# Patient Record
Sex: Female | Born: 1945 | Race: Black or African American | Hispanic: No | Marital: Married | Smoking: Never smoker
Health system: Southern US, Community
[De-identification: ages and names within clinical notes are randomized; demographics above are authoritative.]

## PROBLEM LIST (undated history)

## (undated) DIAGNOSIS — I10 Essential (primary) hypertension: Secondary | ICD-10-CM

## (undated) DIAGNOSIS — E119 Type 2 diabetes mellitus without complications: Secondary | ICD-10-CM

---

## 2014-08-23 ENCOUNTER — Encounter (HOSPITAL_BASED_OUTPATIENT_CLINIC_OR_DEPARTMENT_OTHER): Payer: Self-pay | Admitting: Emergency Medicine

## 2014-08-23 ENCOUNTER — Emergency Department (HOSPITAL_BASED_OUTPATIENT_CLINIC_OR_DEPARTMENT_OTHER): Payer: Medicare Other

## 2014-08-23 ENCOUNTER — Emergency Department (HOSPITAL_BASED_OUTPATIENT_CLINIC_OR_DEPARTMENT_OTHER)
Admission: EM | Admit: 2014-08-23 | Discharge: 2014-08-23 | Disposition: A | Discharge status: Home / Self Care | Payer: Medicare Other | Attending: Emergency Medicine | Admitting: Emergency Medicine

## 2014-08-23 DIAGNOSIS — R109 Unspecified abdominal pain: Secondary | ICD-10-CM | POA: Diagnosis present

## 2014-08-23 DIAGNOSIS — K5900 Constipation, unspecified: Secondary | ICD-10-CM | POA: Insufficient documentation

## 2014-08-23 DIAGNOSIS — E1165 Type 2 diabetes mellitus with hyperglycemia: Secondary | ICD-10-CM | POA: Diagnosis not present

## 2014-08-23 DIAGNOSIS — I10 Essential (primary) hypertension: Secondary | ICD-10-CM | POA: Diagnosis not present

## 2014-08-23 DIAGNOSIS — K5732 Diverticulitis of large intestine without perforation or abscess without bleeding: Secondary | ICD-10-CM | POA: Insufficient documentation

## 2014-08-23 DIAGNOSIS — R739 Hyperglycemia, unspecified: Secondary | ICD-10-CM

## 2014-08-23 HISTORY — DX: Essential (primary) hypertension: I10

## 2014-08-23 HISTORY — DX: Type 2 diabetes mellitus without complications: E11.9

## 2014-08-23 LAB — URINALYSIS, ROUTINE W REFLEX MICROSCOPIC
Bilirubin Urine: NEGATIVE
Glucose, UA: NEGATIVE mg/dL
Hgb urine dipstick: NEGATIVE
Ketones, ur: NEGATIVE mg/dL
Leukocytes, UA: NEGATIVE
NITRITE: NEGATIVE
Protein, ur: NEGATIVE mg/dL
Specific Gravity, Urine: 1.026 (ref 1.005–1.030)
UROBILINOGEN UA: 1 mg/dL (ref 0.0–1.0)
pH: 5.5 (ref 5.0–8.0)

## 2014-08-23 LAB — COMPREHENSIVE METABOLIC PANEL
ALBUMIN: 3.5 g/dL (ref 3.5–5.0)
ALT: 44 U/L (ref 14–54)
ANION GAP: 9 (ref 5–15)
AST: 41 U/L (ref 15–41)
Alkaline Phosphatase: 73 U/L (ref 38–126)
BUN: 14 mg/dL (ref 6–20)
CO2: 30 mmol/L (ref 22–32)
Calcium: 9.3 mg/dL (ref 8.9–10.3)
Chloride: 98 mmol/L — ABNORMAL LOW (ref 101–111)
Creatinine, Ser: 0.82 mg/dL (ref 0.44–1.00)
GFR calc Af Amer: >60 mL/min (ref >60)
GFR calc non Af Amer: >60 mL/min (ref >60)
Glucose, Bld: 245 mg/dL — ABNORMAL HIGH (ref 65–99)
Potassium: 3.6 mmol/L (ref 3.5–5.1)
SODIUM: 137 mmol/L (ref 135–145)
Total Bilirubin: 0.4 mg/dL (ref 0.3–1.2)
Total Protein: 7.8 g/dL (ref 6.5–8.1)

## 2014-08-23 LAB — CBC WITH DIFFERENTIAL/PLATELET
Basophils Absolute: 0 K/uL (ref 0.0–0.1)
Basophils Relative: 0 % (ref 0–1)
Eosinophils Absolute: 0.3 K/uL (ref 0.0–0.7)
Eosinophils Relative: 3 % (ref 0–5)
HCT: 40.7 % (ref 36.0–46.0)
Hemoglobin: 12.7 g/dL (ref 12.0–15.0)
Lymphocytes Relative: 28 % (ref 12–46)
Lymphs Abs: 3.2 K/uL (ref 0.7–4.0)
MCH: 28.8 pg (ref 26.0–34.0)
MCHC: 31.2 g/dL (ref 30.0–36.0)
MCV: 92.3 fL (ref 78.0–100.0)
Monocytes Absolute: 0.6 K/uL (ref 0.1–1.0)
Monocytes Relative: 5 % (ref 3–12)
Neutro Abs: 7.5 K/uL (ref 1.7–7.7)
Neutrophils Relative %: 64 % (ref 43–77)
Platelets: 302 K/uL (ref 150–400)
RBC: 4.41 MIL/uL (ref 3.87–5.11)
RDW: 14.4 % (ref 11.5–15.5)
WBC: 11.6 K/uL — ABNORMAL HIGH (ref 4.0–10.5)

## 2014-08-23 MED ORDER — METRONIDAZOLE 500 MG PO TABS
500.0000 mg | ORAL_TABLET | Freq: Once | ORAL | Status: AC
  Administered 2014-08-23: 500 mg via ORAL
  Filled 2014-08-23: qty 1

## 2014-08-23 MED ORDER — CIPROFLOXACIN HCL 500 MG PO TABS
500.0000 mg | ORAL_TABLET | Freq: Once | ORAL | Status: AC
  Administered 2014-08-23: 500 mg via ORAL
  Filled 2014-08-23: qty 1

## 2014-08-23 MED ORDER — CIPROFLOXACIN HCL 500 MG PO TABS: 500.0000 mg | ORAL_TABLET | Freq: Two times a day (BID) | ORAL | Status: DC

## 2014-08-23 MED ORDER — SODIUM CHLORIDE 0.9 % IV BOLUS (SEPSIS)
1000.0000 mL | Freq: Once | INTRAVENOUS | Status: AC
  Administered 2014-08-23: 1000 mL via INTRAVENOUS

## 2014-08-23 MED ORDER — METRONIDAZOLE 500 MG PO TABS: 500.0000 mg | ORAL_TABLET | Freq: Two times a day (BID) | ORAL | Status: DC

## 2014-08-23 NOTE — ED Notes (Signed)
Pt in c/o lower abdominal pain x 1 week. Endorses constipation, dysuria, and frequency.

## 2014-08-23 NOTE — Discharge Instructions (Signed)
Diverticulitis °Diverticulitis is when small pockets that have formed in your colon (large intestine) become infected or swollen. °HOME CARE °· Follow your doctor's instructions. °· Follow a special diet if told by your doctor. °· When you feel better, your doctor may tell you to change your diet. You may be told to eat a lot of fiber. Fruits and vegetables are good sources of fiber. Fiber makes it easier to poop (have bowel movements). °· Take supplements or probiotics as told by your doctor. °· Only take medicines as told by your doctor. °· Keep all follow-up visits with your doctor. °GET HELP IF: °· Your pain does not get better. °· You have a hard time eating food. °· You are not pooping like normal. °GET HELP RIGHT AWAY IF: °· Your pain gets worse. °· Your problems do not get better. °· Your problems suddenly get worse. °· You have a fever. °· You keep throwing up (vomiting). °· You have bloody or black, tarry poop (stool). °MAKE SURE YOU:  °· Understand these instructions. °· Will watch your condition. °· Will get help right away if you are not doing well or get worse. °Document Released: 07/19/2007 Document Revised: 02/04/2013 Document Reviewed: 12/25/2012 °ExitCare® Patient Information ©2015 [employer]. This information is not intended to replace advice given to you by your health care provider. Make sure you discuss any questions you have with your health care provider. ° °

## 2014-08-23 NOTE — ED Provider Notes (Signed)
CSN: [id number]     Arrival date & time 08/23/14  2103 History   This chart was scribed for Regina Grizzleanielle Ashani Pumphrey, MD by Abel PrestoKara Demonbreun, ED Scribe. This patient was seen in room MH05/MH05 and the patient's care was started at 9:22 PM.    Chief Complaint  Patient presents with  . Abdominal Pain     The history is provided by the patient. No language interpreter was used.   HPI Comments: Regina Lee is a 69 y.o. female who presents to the Emergency Department complaining of intermittent cramping abdominal pain with onset 2 weeks ago. She reports today's episode began just after eating. Pt notes associated constipation and chills. She took a laxative for relief last night. She reports her last BM was today, she states it was "not much". She states she sometimes feels an urge to deficate but is unable to.  Pt reports h/o borderline DM and HTN. She takes HCTZ and metformin. She denies fever.   Past Medical History  Diagnosis Date  . Hypertension   . Diabetes mellitus without complication    History reviewed. No pertinent past surgical history. History reviewed. No pertinent family history. History  Substance Use Topics  . Smoking status: Never Smoker   . Smokeless tobacco: Never Used  . Alcohol Use: No   OB History    No data available     Review of Systems  Constitutional: Positive for chills. Negative for fever.  Gastrointestinal: Positive for abdominal pain and constipation.  All other systems reviewed and are negative.     Allergies  Review of patient's allergies indicates no known allergies.  Home Medications   Prior to Admission medications   Not on File   BP 163/91 mmHg  Pulse 111  Temp(Src) 98.9 F (37.2 C) (Oral)  Resp 18  Ht 5\' 3"  (1.6 m)  Wt 290 lb (131.543 kg)  BMI 51.38 kg/m2  SpO2 98% Physical Exam  Constitutional: She is oriented to person, place, and time. She appears well-developed and well-nourished.  HENT:  Head: Normocephalic.  Eyes: Conjunctivae are  normal.  Neck: Normal range of motion. Neck supple.  Pulmonary/Chest: Effort normal.  Abdominal: There is tenderness (mild diffuse lower abdomen ) in the suprapubic area and left lower quadrant.  Genitourinary:  Rectal exam: normal  Musculoskeletal: Normal range of motion.  Neurological: She is alert and oriented to person, place, and time.  Skin: Skin is warm and dry.  Psychiatric: She has a normal mood and affect. Her behavior is normal.  Nursing note and vitals reviewed.   ED Course  Procedures (including critical care time) DIAGNOSTIC STUDIES: Oxygen Saturation is 98% on room air, normal by my interpretation.    COORDINATION OF CARE: 9:28 PM Discussed treatment plan with patietnat beside, the patient agrees with the plan and has no further questions at this time.   Labs Review Labs Reviewed  URINALYSIS, ROUTINE W REFLEX MICROSCOPIC (NOT AT [facility]) - Abnormal; Notable for the following:    APPearance CLOUDY (*)    All other components within normal limits  CBC WITH DIFFERENTIAL/PLATELET - Abnormal; Notable for the following:    WBC 11.6 (*)    All other components within normal limits  COMPREHENSIVE METABOLIC PANEL - Abnormal; Notable for the following:    Chloride 98 (*)    Glucose, Bld 245 (*)    All other components within normal limits    Imaging Review Dg Abd 1 View  08/23/2014   CLINICAL DATA:  Cramping abdominal pain,  onset 2 weeks ago. Today began just after eating. Constipation and chills. History of hypertension and diabetes.  EXAM: ABDOMEN - 1 VIEW  COMPARISON:  None.  FINDINGS: Gas and stool in the colon. No small or large bowel distention. No radiopaque stones identified. Calcifications in the pelvis are probably phleboliths. Degenerative changes in the spine and hips.  IMPRESSION: Normal nonobstructive bowel gas pattern.   Electronically Signed   By: Burman Nieves M.D.   On: 08/23/2014 22:12     EKG Interpretation None      MDM   Final diagnoses:   Diverticulitis of large intestine without perforation or abscess without bleeding  Hyperglycemia   68 year old female who complains of some lower abdominal pain intermittently over the past week. She feels like she is constipated she feels like she needs to have a bowel movement on a regular basis although she has been stooling normally. She denies nausea, vomiting, diarrhea, fever, or chills. Her exam reveals some mild tenderness in suprapubic and left lower lobe quadrant quadrant area. White blood cell count is elevated at 11,600. Her blood sugar is elevated at 245 but she has not been taking her medications on a regular basis. Patient is advised to take her metformin. She has a very equivocal exam with very mild tenderness to deep palpation in the left lower quadrant. However, given her symptoms and her diabetes she will be treated empirically for diverticulitis She is given a prescription for Cipro and Flagyl. She is advised have close follow-up with her primary care physician. I personally performed the services described in this documentation, which was scribed in my presence. The recorded information has been reviewed and considered.     Regina Grizzle, MD 08/23/14 2251

## 2015-04-12 DIAGNOSIS — Z83511 Family history of glaucoma: Secondary | ICD-10-CM | POA: Diagnosis not present

## 2015-04-12 DIAGNOSIS — H40053 Ocular hypertension, bilateral: Secondary | ICD-10-CM | POA: Diagnosis not present

## 2015-08-09 DIAGNOSIS — M85862 Other specified disorders of bone density and structure, left lower leg: Secondary | ICD-10-CM | POA: Diagnosis not present

## 2015-08-09 DIAGNOSIS — I10 Essential (primary) hypertension: Secondary | ICD-10-CM | POA: Diagnosis not present

## 2015-08-09 DIAGNOSIS — Z23 Encounter for immunization: Secondary | ICD-10-CM | POA: Diagnosis not present

## 2015-08-09 DIAGNOSIS — E559 Vitamin D deficiency, unspecified: Secondary | ICD-10-CM | POA: Diagnosis not present

## 2015-08-09 DIAGNOSIS — Z9189 Other specified personal risk factors, not elsewhere classified: Secondary | ICD-10-CM | POA: Diagnosis not present

## 2015-08-09 DIAGNOSIS — Z13220 Encounter for screening for lipoid disorders: Secondary | ICD-10-CM | POA: Diagnosis not present

## 2015-08-09 DIAGNOSIS — Z7189 Other specified counseling: Secondary | ICD-10-CM | POA: Diagnosis not present

## 2015-08-09 DIAGNOSIS — E119 Type 2 diabetes mellitus without complications: Secondary | ICD-10-CM | POA: Diagnosis not present

## 2015-08-09 DIAGNOSIS — R05 Cough: Secondary | ICD-10-CM | POA: Diagnosis not present

## 2015-08-09 DIAGNOSIS — Z780 Asymptomatic menopausal state: Secondary | ICD-10-CM | POA: Diagnosis not present

## 2015-08-09 DIAGNOSIS — R5383 Other fatigue: Secondary | ICD-10-CM | POA: Diagnosis not present

## 2015-08-09 DIAGNOSIS — E1159 Type 2 diabetes mellitus with other circulatory complications: Secondary | ICD-10-CM | POA: Diagnosis not present

## 2015-08-26 DIAGNOSIS — K8080 Other cholelithiasis without obstruction: Secondary | ICD-10-CM | POA: Diagnosis not present

## 2015-08-26 DIAGNOSIS — R7989 Other specified abnormal findings of blood chemistry: Secondary | ICD-10-CM | POA: Diagnosis not present

## 2015-08-30 DIAGNOSIS — R05 Cough: Secondary | ICD-10-CM | POA: Diagnosis not present

## 2015-08-30 DIAGNOSIS — R062 Wheezing: Secondary | ICD-10-CM | POA: Diagnosis not present

## 2015-08-30 DIAGNOSIS — Z7984 Long term (current) use of oral hypoglycemic drugs: Secondary | ICD-10-CM | POA: Diagnosis not present

## 2015-08-30 DIAGNOSIS — E119 Type 2 diabetes mellitus without complications: Secondary | ICD-10-CM | POA: Diagnosis not present

## 2015-08-30 DIAGNOSIS — T464X5A Adverse effect of angiotensin-converting-enzyme inhibitors, initial encounter: Secondary | ICD-10-CM | POA: Diagnosis not present

## 2015-08-30 DIAGNOSIS — Z794 Long term (current) use of insulin: Secondary | ICD-10-CM | POA: Diagnosis not present

## 2015-08-30 DIAGNOSIS — I10 Essential (primary) hypertension: Secondary | ICD-10-CM | POA: Diagnosis not present

## 2015-08-30 DIAGNOSIS — Z6841 Body Mass Index (BMI) 40.0 and over, adult: Secondary | ICD-10-CM | POA: Diagnosis not present

## 2015-08-30 DIAGNOSIS — Z7951 Long term (current) use of inhaled steroids: Secondary | ICD-10-CM | POA: Diagnosis not present

## 2015-08-30 DIAGNOSIS — E1159 Type 2 diabetes mellitus with other circulatory complications: Secondary | ICD-10-CM | POA: Diagnosis not present

## 2015-08-30 DIAGNOSIS — Z9189 Other specified personal risk factors, not elsewhere classified: Secondary | ICD-10-CM | POA: Diagnosis not present

## 2015-08-30 DIAGNOSIS — R748 Abnormal levels of other serum enzymes: Secondary | ICD-10-CM | POA: Diagnosis not present

## 2015-08-30 DIAGNOSIS — Z79899 Other long term (current) drug therapy: Secondary | ICD-10-CM | POA: Diagnosis not present

## 2015-08-30 DIAGNOSIS — R011 Cardiac murmur, unspecified: Secondary | ICD-10-CM | POA: Diagnosis not present

## 2015-09-07 DIAGNOSIS — E119 Type 2 diabetes mellitus without complications: Secondary | ICD-10-CM | POA: Diagnosis not present

## 2015-09-16 DIAGNOSIS — Z780 Asymptomatic menopausal state: Secondary | ICD-10-CM | POA: Diagnosis not present

## 2015-09-28 DIAGNOSIS — E119 Type 2 diabetes mellitus without complications: Secondary | ICD-10-CM | POA: Diagnosis not present

## 2015-10-15 DIAGNOSIS — E559 Vitamin D deficiency, unspecified: Secondary | ICD-10-CM | POA: Diagnosis not present

## 2015-10-15 DIAGNOSIS — Z6841 Body Mass Index (BMI) 40.0 and over, adult: Secondary | ICD-10-CM | POA: Diagnosis not present

## 2015-10-15 DIAGNOSIS — E119 Type 2 diabetes mellitus without complications: Secondary | ICD-10-CM | POA: Diagnosis not present

## 2015-10-15 DIAGNOSIS — R748 Abnormal levels of other serum enzymes: Secondary | ICD-10-CM | POA: Diagnosis not present

## 2015-10-15 DIAGNOSIS — R011 Cardiac murmur, unspecified: Secondary | ICD-10-CM | POA: Diagnosis not present

## 2015-11-03 DIAGNOSIS — R011 Cardiac murmur, unspecified: Secondary | ICD-10-CM | POA: Diagnosis not present

## 2015-11-15 DIAGNOSIS — R748 Abnormal levels of other serum enzymes: Secondary | ICD-10-CM | POA: Diagnosis not present

## 2015-11-15 DIAGNOSIS — E1159 Type 2 diabetes mellitus with other circulatory complications: Secondary | ICD-10-CM | POA: Diagnosis not present

## 2015-11-15 DIAGNOSIS — E559 Vitamin D deficiency, unspecified: Secondary | ICD-10-CM | POA: Diagnosis not present

## 2015-11-15 DIAGNOSIS — I10 Essential (primary) hypertension: Secondary | ICD-10-CM | POA: Diagnosis not present

## 2015-11-15 DIAGNOSIS — Z9189 Other specified personal risk factors, not elsewhere classified: Secondary | ICD-10-CM | POA: Diagnosis not present

## 2015-11-15 DIAGNOSIS — Z6841 Body Mass Index (BMI) 40.0 and over, adult: Secondary | ICD-10-CM | POA: Diagnosis not present

## 2015-11-15 DIAGNOSIS — R011 Cardiac murmur, unspecified: Secondary | ICD-10-CM | POA: Diagnosis not present

## 2015-11-15 DIAGNOSIS — R5383 Other fatigue: Secondary | ICD-10-CM | POA: Diagnosis not present

## 2015-11-15 DIAGNOSIS — E119 Type 2 diabetes mellitus without complications: Secondary | ICD-10-CM | POA: Diagnosis not present

## 2015-12-27 DIAGNOSIS — E559 Vitamin D deficiency, unspecified: Secondary | ICD-10-CM | POA: Diagnosis not present

## 2015-12-27 DIAGNOSIS — T464X5A Adverse effect of angiotensin-converting-enzyme inhibitors, initial encounter: Secondary | ICD-10-CM | POA: Diagnosis not present

## 2015-12-27 DIAGNOSIS — Z6841 Body Mass Index (BMI) 40.0 and over, adult: Secondary | ICD-10-CM | POA: Diagnosis not present

## 2015-12-27 DIAGNOSIS — I10 Essential (primary) hypertension: Secondary | ICD-10-CM | POA: Diagnosis not present

## 2015-12-27 DIAGNOSIS — Z9189 Other specified personal risk factors, not elsewhere classified: Secondary | ICD-10-CM | POA: Diagnosis not present

## 2015-12-27 DIAGNOSIS — R748 Abnormal levels of other serum enzymes: Secondary | ICD-10-CM | POA: Diagnosis not present

## 2015-12-27 DIAGNOSIS — R7989 Other specified abnormal findings of blood chemistry: Secondary | ICD-10-CM | POA: Diagnosis not present

## 2015-12-27 DIAGNOSIS — R05 Cough: Secondary | ICD-10-CM | POA: Diagnosis not present

## 2015-12-27 DIAGNOSIS — E1159 Type 2 diabetes mellitus with other circulatory complications: Secondary | ICD-10-CM | POA: Diagnosis not present

## 2015-12-27 DIAGNOSIS — E119 Type 2 diabetes mellitus without complications: Secondary | ICD-10-CM | POA: Diagnosis not present

## 2015-12-30 DIAGNOSIS — R748 Abnormal levels of other serum enzymes: Secondary | ICD-10-CM | POA: Diagnosis not present

## 2016-01-11 DIAGNOSIS — T464X5A Adverse effect of angiotensin-converting-enzyme inhibitors, initial encounter: Secondary | ICD-10-CM | POA: Diagnosis not present

## 2016-01-11 DIAGNOSIS — I10 Essential (primary) hypertension: Secondary | ICD-10-CM | POA: Diagnosis not present

## 2016-01-11 DIAGNOSIS — E119 Type 2 diabetes mellitus without complications: Secondary | ICD-10-CM | POA: Diagnosis not present

## 2016-01-11 DIAGNOSIS — R748 Abnormal levels of other serum enzymes: Secondary | ICD-10-CM | POA: Diagnosis not present

## 2016-01-11 DIAGNOSIS — Z9189 Other specified personal risk factors, not elsewhere classified: Secondary | ICD-10-CM | POA: Diagnosis not present

## 2016-01-11 DIAGNOSIS — R05 Cough: Secondary | ICD-10-CM | POA: Diagnosis not present

## 2016-01-11 DIAGNOSIS — E1159 Type 2 diabetes mellitus with other circulatory complications: Secondary | ICD-10-CM | POA: Diagnosis not present

## 2016-01-13 DIAGNOSIS — R7989 Other specified abnormal findings of blood chemistry: Secondary | ICD-10-CM | POA: Diagnosis not present

## 2016-01-26 ENCOUNTER — Encounter (HOSPITAL_BASED_OUTPATIENT_CLINIC_OR_DEPARTMENT_OTHER): Payer: Self-pay

## 2016-01-26 ENCOUNTER — Emergency Department (HOSPITAL_BASED_OUTPATIENT_CLINIC_OR_DEPARTMENT_OTHER)
Admission: EM | Admit: 2016-01-26 | Discharge: 2016-01-26 | Disposition: A | Discharge status: Home / Self Care | Payer: PPO | Attending: Emergency Medicine | Admitting: Emergency Medicine

## 2016-01-26 DIAGNOSIS — Z7984 Long term (current) use of oral hypoglycemic drugs: Secondary | ICD-10-CM | POA: Insufficient documentation

## 2016-01-26 DIAGNOSIS — Y9389 Activity, other specified: Secondary | ICD-10-CM | POA: Diagnosis not present

## 2016-01-26 DIAGNOSIS — E119 Type 2 diabetes mellitus without complications: Secondary | ICD-10-CM | POA: Diagnosis not present

## 2016-01-26 DIAGNOSIS — Y999 Unspecified external cause status: Secondary | ICD-10-CM | POA: Diagnosis not present

## 2016-01-26 DIAGNOSIS — I10 Essential (primary) hypertension: Secondary | ICD-10-CM | POA: Diagnosis not present

## 2016-01-26 DIAGNOSIS — Z7982 Long term (current) use of aspirin: Secondary | ICD-10-CM | POA: Diagnosis not present

## 2016-01-26 DIAGNOSIS — X58XXXA Exposure to other specified factors, initial encounter: Secondary | ICD-10-CM | POA: Insufficient documentation

## 2016-01-26 DIAGNOSIS — Z79899 Other long term (current) drug therapy: Secondary | ICD-10-CM | POA: Insufficient documentation

## 2016-01-26 DIAGNOSIS — S46812A Strain of other muscles, fascia and tendons at shoulder and upper arm level, left arm, initial encounter: Secondary | ICD-10-CM | POA: Diagnosis not present

## 2016-01-26 DIAGNOSIS — Y929 Unspecified place or not applicable: Secondary | ICD-10-CM | POA: Diagnosis not present

## 2016-01-26 DIAGNOSIS — S56912A Strain of unspecified muscles, fascia and tendons at forearm level, left arm, initial encounter: Secondary | ICD-10-CM | POA: Diagnosis not present

## 2016-01-26 DIAGNOSIS — S4992XA Unspecified injury of left shoulder and upper arm, initial encounter: Secondary | ICD-10-CM | POA: Diagnosis not present

## 2016-01-26 MED ORDER — NAPROXEN 250 MG PO TABS
500.0000 mg | ORAL_TABLET | Freq: Once | ORAL | Status: AC
  Administered 2016-01-26: 500 mg via ORAL
  Filled 2016-01-26: qty 2

## 2016-01-26 MED ORDER — ACETAMINOPHEN 500 MG PO TABS
1000.0000 mg | ORAL_TABLET | Freq: Once | ORAL | Status: AC
  Administered 2016-01-26: 1000 mg via ORAL
  Filled 2016-01-26: qty 2

## 2016-01-26 NOTE — ED Triage Notes (Addendum)
C/o pain to left arm and left posterior neck x 3 days-denies injury-states pain is better with heating pad-NAD-steady gait

## 2016-01-26 NOTE — ED Provider Notes (Addendum)
[facility]   CSN: [id number] Arrival date & time: 01/26/16  1452     History   Chief Complaint Chief Complaint  Patient presents with  . Arm Pain    HPI Regina CoreRenee Lee is a 70 y.o. female.  70 yo F with a chief complaint of left shoulder pain. This been going on for the past 3 days. Patient denies injury. She has been spending a lot of time sitting at a desk for the past few days. Worse with palpation. Denies chest pain denies exertional symptoms denies diaphoresis denies nausea or vomiting.   The history is provided by the patient. The history is limited by a language barrier.  Arm Pain  This is a new problem. The current episode started less than 1 hour ago. The problem occurs constantly. The problem has not changed since onset.Pertinent negatives include no chest pain, no headaches and no shortness of breath. The symptoms are aggravated by twisting. Nothing relieves the symptoms. She has tried nothing for the symptoms. The treatment provided no relief.    Past Medical History:  Diagnosis Date  . Diabetes mellitus without complication (HCC)   . Hypertension     There are no active problems to display for this patient.   History reviewed. No pertinent surgical history.  OB History    No data available       Home Medications    Prior to Admission medications   Medication Sig Start Date End Date Taking? Authorizing Provider  aspirin 81 MG chewable tablet Chew by mouth daily.   Yes Historical Provider, MD  calcium-vitamin D (OSCAL WITH D) 500-200 MG-UNIT tablet Take 1 tablet by mouth.   Yes Historical Provider, MD  chlorthalidone (HYGROTON) 25 MG tablet Take 25 mg by mouth daily.   Yes Historical Provider, MD  cyanocobalamin 100 MCG tablet Take 100 mcg by mouth daily.   Yes Historical Provider, MD  glipiZIDE (GLUCOTROL) 5 MG tablet Take by mouth daily before breakfast.   Yes Historical Provider, MD  losartan (COZAAR) 25 MG tablet Take 25 mg  by mouth daily.   Yes Historical Provider, MD  metFORMIN (GLUCOPHAGE) 500 MG tablet Take by mouth 2 (two) times daily with a meal.   Yes Historical Provider, MD    Family History No family history on file.  Social History Social History  Substance Use Topics  . Smoking status: Never Smoker  . Smokeless tobacco: Never Used  . Alcohol use No     Allergies   Patient has no known allergies.   Review of Systems Review of Systems  Constitutional: Negative for chills and fever.  HENT: Negative for congestion and rhinorrhea.   Eyes: Negative for redness and visual disturbance.  Respiratory: Negative for shortness of breath and wheezing.   Cardiovascular: Negative for chest pain and palpitations.  Gastrointestinal: Negative for nausea and vomiting.  Genitourinary: Negative for dysuria and urgency.  Musculoskeletal: Positive for arthralgias and myalgias.  Skin: Negative for pallor and wound.  Neurological: Negative for dizziness and headaches.     Physical Exam Updated Vital Signs BP 153/88 (BP Location: Right Arm)   Pulse 102   Temp 98 F (36.7 C) (Oral)   Resp 20   Ht 5\' 2"  (1.575 m)   Wt 285 lb (129.3 kg)   SpO2 98%   BMI 52.13 kg/m   Physical Exam  Constitutional: She is oriented to person, place, and time. She appears well-developed and well-nourished. No distress.  HENT:  Head:  Normocephalic and atraumatic.  Eyes: EOM are normal. Pupils are equal, round, and reactive to light.  Neck: Normal range of motion. Neck supple.  Cardiovascular: Normal rate and regular rhythm.  Exam reveals no gallop and no friction rub.   No murmur heard. Pulmonary/Chest: Effort normal. She has no wheezes. She has no rales.  Abdominal: Soft. She exhibits no distension. There is no tenderness.  Musculoskeletal: She exhibits tenderness (L sided trapezius spasm and TTP focally, reproduces symptoms.  PMS intact distally). She exhibits no edema.  Neurological: She is alert and oriented to  person, place, and time.  Skin: Skin is warm and dry. She is not diaphoretic.  Psychiatric: She has a normal mood and affect. Her behavior is normal.  Nursing note and vitals reviewed.    ED Treatments / Results  Labs (all labs ordered are listed, but only abnormal results are displayed) Labs Reviewed - No data to display  EKG  EKG Interpretation  Date/Time:  Wednesday January 26 2016 15:07:49 EST Ventricular Rate:  95 PR Interval:    QRS Duration: 72 QT Interval:  360 QTC Calculation: 453 R Axis:   8 Text Interpretation:  Sinus rhythm Low voltage, precordial leads No old tracing to compare Confirmed by Tyse Auriemma MD, DANIEL [id number]) on 01/26/2016 3:59:02 PM       Radiology No results found.  Procedures Procedures (including critical care time)  Medications Ordered in ED Medications  acetaminophen (TYLENOL) tablet 1,000 mg (not administered)  naproxen (NAPROSYN) tablet 500 mg (not administered)     Initial Impression / Assessment and Plan / ED Course  I have reviewed the triage vital signs and the nursing notes.  Pertinent labs & imaging results that were available during my care of the patient were reviewed by me and considered in my medical decision making (see chart for details).  Clinical Course     70 yo F With a chief complaint of left shoulder pain. Patient appears to have a left-sided trapezius spasm. She has pain with palpation re-creates her symptoms. Worse with movement and palpation of the area. We'll place in a sling, tylenol and NSAIDs PCP follow-up.   3:59 PM:  I have discussed the diagnosis/risks/treatment options with the patient and believe the pt to be eligible for discharge home to follow-up with PCP. We also discussed returning to the ED immediately if new or worsening sx occur. We discussed the sx which are most concerning (e.g., exertional symptoms, diaphoresis nausea or vomiting.) that necessitate immediate return. Medications administered to the  patient during their visit and any new prescriptions provided to the patient are listed below.  Medications given during this visit Medications  acetaminophen (TYLENOL) tablet 1,000 mg (not administered)  naproxen (NAPROSYN) tablet 500 mg (not administered)     The patient appears reasonably screen and/or stabilized for discharge and I doubt any other medical condition or other [facility] requiring further screening, evaluation, or treatment in the ED at this time prior to discharge.   Final Clinical Impressions(s) / ED Diagnoses   Final diagnoses:  Trapezius strain, left, initial encounter    New Prescriptions New Prescriptions   No medications on file     Melene PlanDan Dyllin Gulley, DO 01/26/16 1558    Melene Planan Talvin Christianson, DO 01/26/16 1559

## 2016-01-26 NOTE — Discharge Instructions (Signed)
Take 4 over the counter ibuprofen tablets 3 times a day or 2 over-the-counter naproxen tablets twice a day for pain. Also take tylenol 1000mg(2 extra strength) four times a day.    

## 2016-02-09 DIAGNOSIS — S46812A Strain of other muscles, fascia and tendons at shoulder and upper arm level, left arm, initial encounter: Secondary | ICD-10-CM | POA: Diagnosis not present

## 2016-02-10 DIAGNOSIS — R7989 Other specified abnormal findings of blood chemistry: Secondary | ICD-10-CM | POA: Diagnosis not present

## 2016-02-10 DIAGNOSIS — K760 Fatty (change of) liver, not elsewhere classified: Secondary | ICD-10-CM | POA: Diagnosis not present

## 2016-02-15 DIAGNOSIS — Z7984 Long term (current) use of oral hypoglycemic drugs: Secondary | ICD-10-CM | POA: Diagnosis not present

## 2016-02-15 DIAGNOSIS — M25512 Pain in left shoulder: Secondary | ICD-10-CM | POA: Diagnosis not present

## 2016-02-15 DIAGNOSIS — R200 Anesthesia of skin: Secondary | ICD-10-CM | POA: Diagnosis not present

## 2016-02-15 DIAGNOSIS — R202 Paresthesia of skin: Secondary | ICD-10-CM | POA: Diagnosis not present

## 2016-02-15 DIAGNOSIS — E119 Type 2 diabetes mellitus without complications: Secondary | ICD-10-CM | POA: Diagnosis not present

## 2016-02-15 DIAGNOSIS — Z79899 Other long term (current) drug therapy: Secondary | ICD-10-CM | POA: Diagnosis not present

## 2016-02-15 DIAGNOSIS — I10 Essential (primary) hypertension: Secondary | ICD-10-CM | POA: Diagnosis not present

## 2016-02-17 DIAGNOSIS — M7542 Impingement syndrome of left shoulder: Secondary | ICD-10-CM | POA: Diagnosis not present

## 2016-02-17 DIAGNOSIS — M25512 Pain in left shoulder: Secondary | ICD-10-CM | POA: Diagnosis not present

## 2016-02-17 DIAGNOSIS — M19012 Primary osteoarthritis, left shoulder: Secondary | ICD-10-CM | POA: Diagnosis not present

## 2016-02-22 DIAGNOSIS — Z6841 Body Mass Index (BMI) 40.0 and over, adult: Secondary | ICD-10-CM | POA: Diagnosis not present

## 2016-02-22 DIAGNOSIS — R202 Paresthesia of skin: Secondary | ICD-10-CM | POA: Diagnosis not present

## 2016-02-22 DIAGNOSIS — R748 Abnormal levels of other serum enzymes: Secondary | ICD-10-CM | POA: Diagnosis not present

## 2016-02-22 DIAGNOSIS — R200 Anesthesia of skin: Secondary | ICD-10-CM | POA: Diagnosis not present

## 2016-02-22 DIAGNOSIS — E119 Type 2 diabetes mellitus without complications: Secondary | ICD-10-CM | POA: Diagnosis not present

## 2016-02-22 DIAGNOSIS — I10 Essential (primary) hypertension: Secondary | ICD-10-CM | POA: Diagnosis not present

## 2016-02-22 DIAGNOSIS — E1159 Type 2 diabetes mellitus with other circulatory complications: Secondary | ICD-10-CM | POA: Diagnosis not present

## 2016-02-22 DIAGNOSIS — Z1231 Encounter for screening mammogram for malignant neoplasm of breast: Secondary | ICD-10-CM | POA: Diagnosis not present

## 2016-03-07 DIAGNOSIS — E119 Type 2 diabetes mellitus without complications: Secondary | ICD-10-CM | POA: Diagnosis not present

## 2016-03-07 DIAGNOSIS — Z9189 Other specified personal risk factors, not elsewhere classified: Secondary | ICD-10-CM | POA: Diagnosis not present

## 2016-03-07 DIAGNOSIS — N950 Postmenopausal bleeding: Secondary | ICD-10-CM | POA: Diagnosis not present

## 2016-03-07 DIAGNOSIS — E1159 Type 2 diabetes mellitus with other circulatory complications: Secondary | ICD-10-CM | POA: Diagnosis not present

## 2016-03-07 DIAGNOSIS — Z6841 Body Mass Index (BMI) 40.0 and over, adult: Secondary | ICD-10-CM | POA: Diagnosis not present

## 2016-03-07 DIAGNOSIS — Z87448 Personal history of other diseases of urinary system: Secondary | ICD-10-CM | POA: Diagnosis not present

## 2016-03-07 DIAGNOSIS — I10 Essential (primary) hypertension: Secondary | ICD-10-CM | POA: Diagnosis not present

## 2016-04-12 DIAGNOSIS — E1159 Type 2 diabetes mellitus with other circulatory complications: Secondary | ICD-10-CM | POA: Diagnosis not present

## 2016-04-12 DIAGNOSIS — I10 Essential (primary) hypertension: Secondary | ICD-10-CM | POA: Diagnosis not present

## 2016-04-12 DIAGNOSIS — E119 Type 2 diabetes mellitus without complications: Secondary | ICD-10-CM | POA: Diagnosis not present

## 2016-04-12 DIAGNOSIS — N950 Postmenopausal bleeding: Secondary | ICD-10-CM | POA: Diagnosis not present

## 2016-04-12 DIAGNOSIS — Z01419 Encounter for gynecological examination (general) (routine) without abnormal findings: Secondary | ICD-10-CM | POA: Diagnosis not present

## 2016-04-12 DIAGNOSIS — Z1151 Encounter for screening for human papillomavirus (HPV): Secondary | ICD-10-CM | POA: Diagnosis not present

## 2016-04-12 DIAGNOSIS — Z124 Encounter for screening for malignant neoplasm of cervix: Secondary | ICD-10-CM | POA: Diagnosis not present

## 2016-04-12 DIAGNOSIS — Z6841 Body Mass Index (BMI) 40.0 and over, adult: Secondary | ICD-10-CM | POA: Diagnosis not present

## 2016-04-19 DIAGNOSIS — R7989 Other specified abnormal findings of blood chemistry: Secondary | ICD-10-CM | POA: Diagnosis not present

## 2016-04-27 DIAGNOSIS — H25013 Cortical age-related cataract, bilateral: Secondary | ICD-10-CM | POA: Diagnosis not present

## 2016-04-27 DIAGNOSIS — H2513 Age-related nuclear cataract, bilateral: Secondary | ICD-10-CM | POA: Diagnosis not present

## 2016-04-27 DIAGNOSIS — H52203 Unspecified astigmatism, bilateral: Secondary | ICD-10-CM | POA: Diagnosis not present

## 2016-04-27 DIAGNOSIS — E119 Type 2 diabetes mellitus without complications: Secondary | ICD-10-CM | POA: Diagnosis not present

## 2016-04-27 DIAGNOSIS — H524 Presbyopia: Secondary | ICD-10-CM | POA: Diagnosis not present

## 2016-04-27 DIAGNOSIS — H5203 Hypermetropia, bilateral: Secondary | ICD-10-CM | POA: Diagnosis not present

## 2016-04-27 DIAGNOSIS — H43393 Other vitreous opacities, bilateral: Secondary | ICD-10-CM | POA: Diagnosis not present

## 2016-04-27 DIAGNOSIS — H40053 Ocular hypertension, bilateral: Secondary | ICD-10-CM | POA: Diagnosis not present

## 2016-06-07 DIAGNOSIS — I10 Essential (primary) hypertension: Secondary | ICD-10-CM | POA: Diagnosis not present

## 2016-06-07 DIAGNOSIS — R0681 Apnea, not elsewhere classified: Secondary | ICD-10-CM | POA: Diagnosis not present

## 2016-06-07 DIAGNOSIS — Z6841 Body Mass Index (BMI) 40.0 and over, adult: Secondary | ICD-10-CM | POA: Diagnosis not present

## 2016-06-07 DIAGNOSIS — E119 Type 2 diabetes mellitus without complications: Secondary | ICD-10-CM | POA: Diagnosis not present

## 2016-06-07 DIAGNOSIS — R748 Abnormal levels of other serum enzymes: Secondary | ICD-10-CM | POA: Diagnosis not present

## 2016-06-07 DIAGNOSIS — E1159 Type 2 diabetes mellitus with other circulatory complications: Secondary | ICD-10-CM | POA: Diagnosis not present

## 2016-06-07 DIAGNOSIS — N950 Postmenopausal bleeding: Secondary | ICD-10-CM | POA: Diagnosis not present

## 2016-06-21 DIAGNOSIS — E1159 Type 2 diabetes mellitus with other circulatory complications: Secondary | ICD-10-CM | POA: Diagnosis not present

## 2016-06-21 DIAGNOSIS — E1165 Type 2 diabetes mellitus with hyperglycemia: Secondary | ICD-10-CM | POA: Diagnosis not present

## 2016-06-21 DIAGNOSIS — Z6841 Body Mass Index (BMI) 40.0 and over, adult: Secondary | ICD-10-CM | POA: Diagnosis not present

## 2016-06-21 DIAGNOSIS — I10 Essential (primary) hypertension: Secondary | ICD-10-CM | POA: Diagnosis not present

## 2016-06-21 DIAGNOSIS — N950 Postmenopausal bleeding: Secondary | ICD-10-CM | POA: Diagnosis not present

## 2016-06-21 DIAGNOSIS — Z9189 Other specified personal risk factors, not elsewhere classified: Secondary | ICD-10-CM | POA: Diagnosis not present

## 2016-06-21 DIAGNOSIS — E119 Type 2 diabetes mellitus without complications: Secondary | ICD-10-CM | POA: Diagnosis not present

## 2016-07-26 DIAGNOSIS — M25511 Pain in right shoulder: Secondary | ICD-10-CM | POA: Diagnosis not present

## 2016-07-26 DIAGNOSIS — G4700 Insomnia, unspecified: Secondary | ICD-10-CM | POA: Diagnosis not present

## 2016-07-26 DIAGNOSIS — E119 Type 2 diabetes mellitus without complications: Secondary | ICD-10-CM | POA: Diagnosis not present

## 2016-07-26 DIAGNOSIS — G8929 Other chronic pain: Secondary | ICD-10-CM | POA: Diagnosis not present

## 2016-07-26 DIAGNOSIS — E1159 Type 2 diabetes mellitus with other circulatory complications: Secondary | ICD-10-CM | POA: Diagnosis not present

## 2016-07-26 DIAGNOSIS — Z6841 Body Mass Index (BMI) 40.0 and over, adult: Secondary | ICD-10-CM | POA: Diagnosis not present

## 2016-07-26 DIAGNOSIS — M25512 Pain in left shoulder: Secondary | ICD-10-CM | POA: Diagnosis not present

## 2016-07-26 DIAGNOSIS — K5900 Constipation, unspecified: Secondary | ICD-10-CM | POA: Diagnosis not present

## 2016-07-26 DIAGNOSIS — I10 Essential (primary) hypertension: Secondary | ICD-10-CM | POA: Diagnosis not present

## 2016-08-09 DIAGNOSIS — M1710 Unilateral primary osteoarthritis, unspecified knee: Secondary | ICD-10-CM | POA: Diagnosis not present

## 2016-08-09 DIAGNOSIS — M25512 Pain in left shoulder: Secondary | ICD-10-CM | POA: Diagnosis not present

## 2016-08-09 DIAGNOSIS — G8929 Other chronic pain: Secondary | ICD-10-CM | POA: Diagnosis not present

## 2016-08-09 DIAGNOSIS — M25511 Pain in right shoulder: Secondary | ICD-10-CM | POA: Diagnosis not present

## 2016-08-10 DIAGNOSIS — R0681 Apnea, not elsewhere classified: Secondary | ICD-10-CM | POA: Diagnosis not present

## 2016-08-10 DIAGNOSIS — Z6841 Body Mass Index (BMI) 40.0 and over, adult: Secondary | ICD-10-CM | POA: Diagnosis not present

## 2016-08-10 DIAGNOSIS — R0683 Snoring: Secondary | ICD-10-CM | POA: Diagnosis not present

## 2016-09-25 DIAGNOSIS — E538 Deficiency of other specified B group vitamins: Secondary | ICD-10-CM | POA: Diagnosis not present

## 2016-09-25 DIAGNOSIS — I10 Essential (primary) hypertension: Secondary | ICD-10-CM | POA: Diagnosis not present

## 2016-09-25 DIAGNOSIS — E119 Type 2 diabetes mellitus without complications: Secondary | ICD-10-CM | POA: Diagnosis not present

## 2016-09-25 DIAGNOSIS — E1159 Type 2 diabetes mellitus with other circulatory complications: Secondary | ICD-10-CM | POA: Diagnosis not present

## 2016-09-25 DIAGNOSIS — Z9889 Other specified postprocedural states: Secondary | ICD-10-CM | POA: Diagnosis not present

## 2016-09-25 DIAGNOSIS — Z6841 Body Mass Index (BMI) 40.0 and over, adult: Secondary | ICD-10-CM | POA: Diagnosis not present

## 2016-10-13 DIAGNOSIS — Z6841 Body Mass Index (BMI) 40.0 and over, adult: Secondary | ICD-10-CM | POA: Diagnosis not present

## 2016-10-13 DIAGNOSIS — G4733 Obstructive sleep apnea (adult) (pediatric): Secondary | ICD-10-CM | POA: Diagnosis not present

## 2016-11-06 DIAGNOSIS — H40053 Ocular hypertension, bilateral: Secondary | ICD-10-CM | POA: Diagnosis not present

## 2016-12-04 DIAGNOSIS — Z1231 Encounter for screening mammogram for malignant neoplasm of breast: Secondary | ICD-10-CM | POA: Diagnosis not present

## 2016-12-04 DIAGNOSIS — E1159 Type 2 diabetes mellitus with other circulatory complications: Secondary | ICD-10-CM | POA: Diagnosis not present

## 2016-12-04 DIAGNOSIS — Z9189 Other specified personal risk factors, not elsewhere classified: Secondary | ICD-10-CM | POA: Diagnosis not present

## 2016-12-04 DIAGNOSIS — Z23 Encounter for immunization: Secondary | ICD-10-CM | POA: Diagnosis not present

## 2017-01-01 DIAGNOSIS — I10 Essential (primary) hypertension: Secondary | ICD-10-CM | POA: Diagnosis not present

## 2017-01-01 DIAGNOSIS — G4733 Obstructive sleep apnea (adult) (pediatric): Secondary | ICD-10-CM | POA: Diagnosis not present

## 2017-01-01 DIAGNOSIS — E1159 Type 2 diabetes mellitus with other circulatory complications: Secondary | ICD-10-CM | POA: Diagnosis not present

## 2017-01-01 DIAGNOSIS — Z1231 Encounter for screening mammogram for malignant neoplasm of breast: Secondary | ICD-10-CM | POA: Diagnosis not present

## 2017-01-01 DIAGNOSIS — Z9189 Other specified personal risk factors, not elsewhere classified: Secondary | ICD-10-CM | POA: Diagnosis not present

## 2017-01-03 IMAGING — DX DG ABDOMEN 1V
2 series · 2 of 2 positions shown · non-contrast
Comparison: None.

CLINICAL DATA: Cramping abdominal pain, onset 2 weeks ago. Today
began just after eating. Constipation and chills. History of
hypertension and diabetes.

EXAM:
ABDOMEN - 1 VIEW

[abdomen kub (1 of 2)]
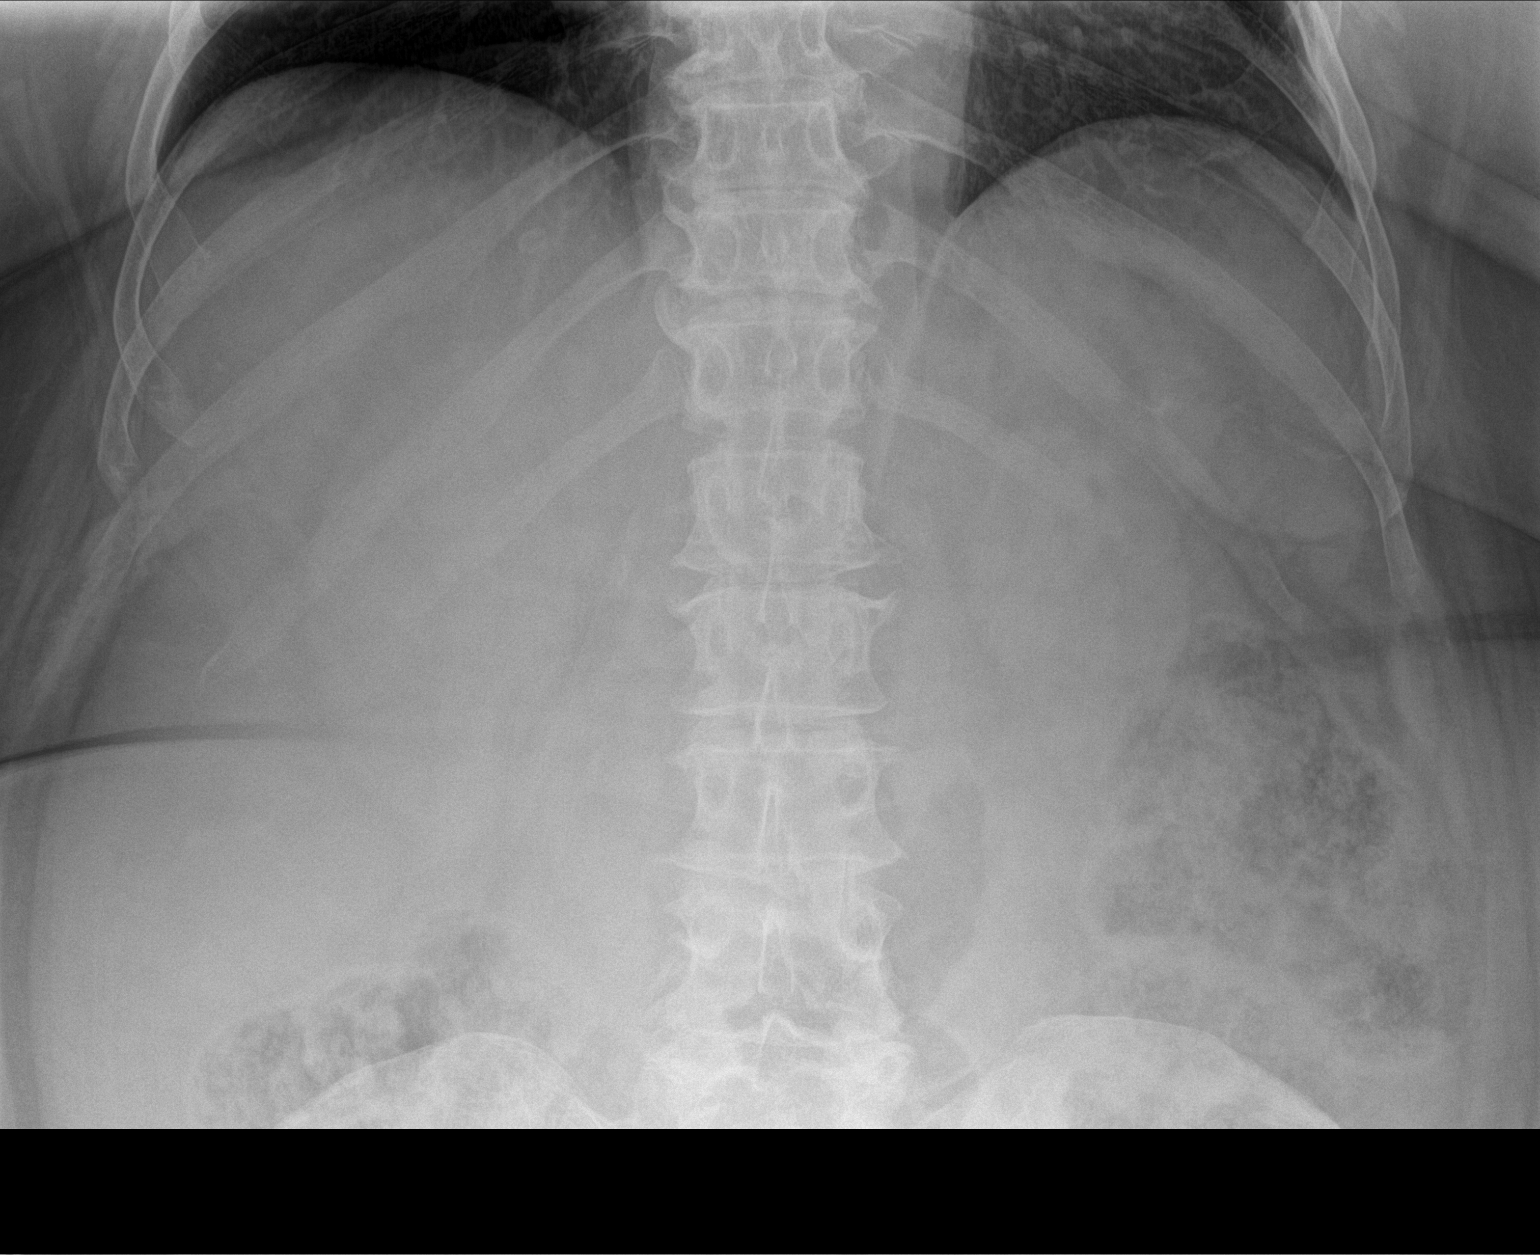

[abdomen kub (2 of 2)]
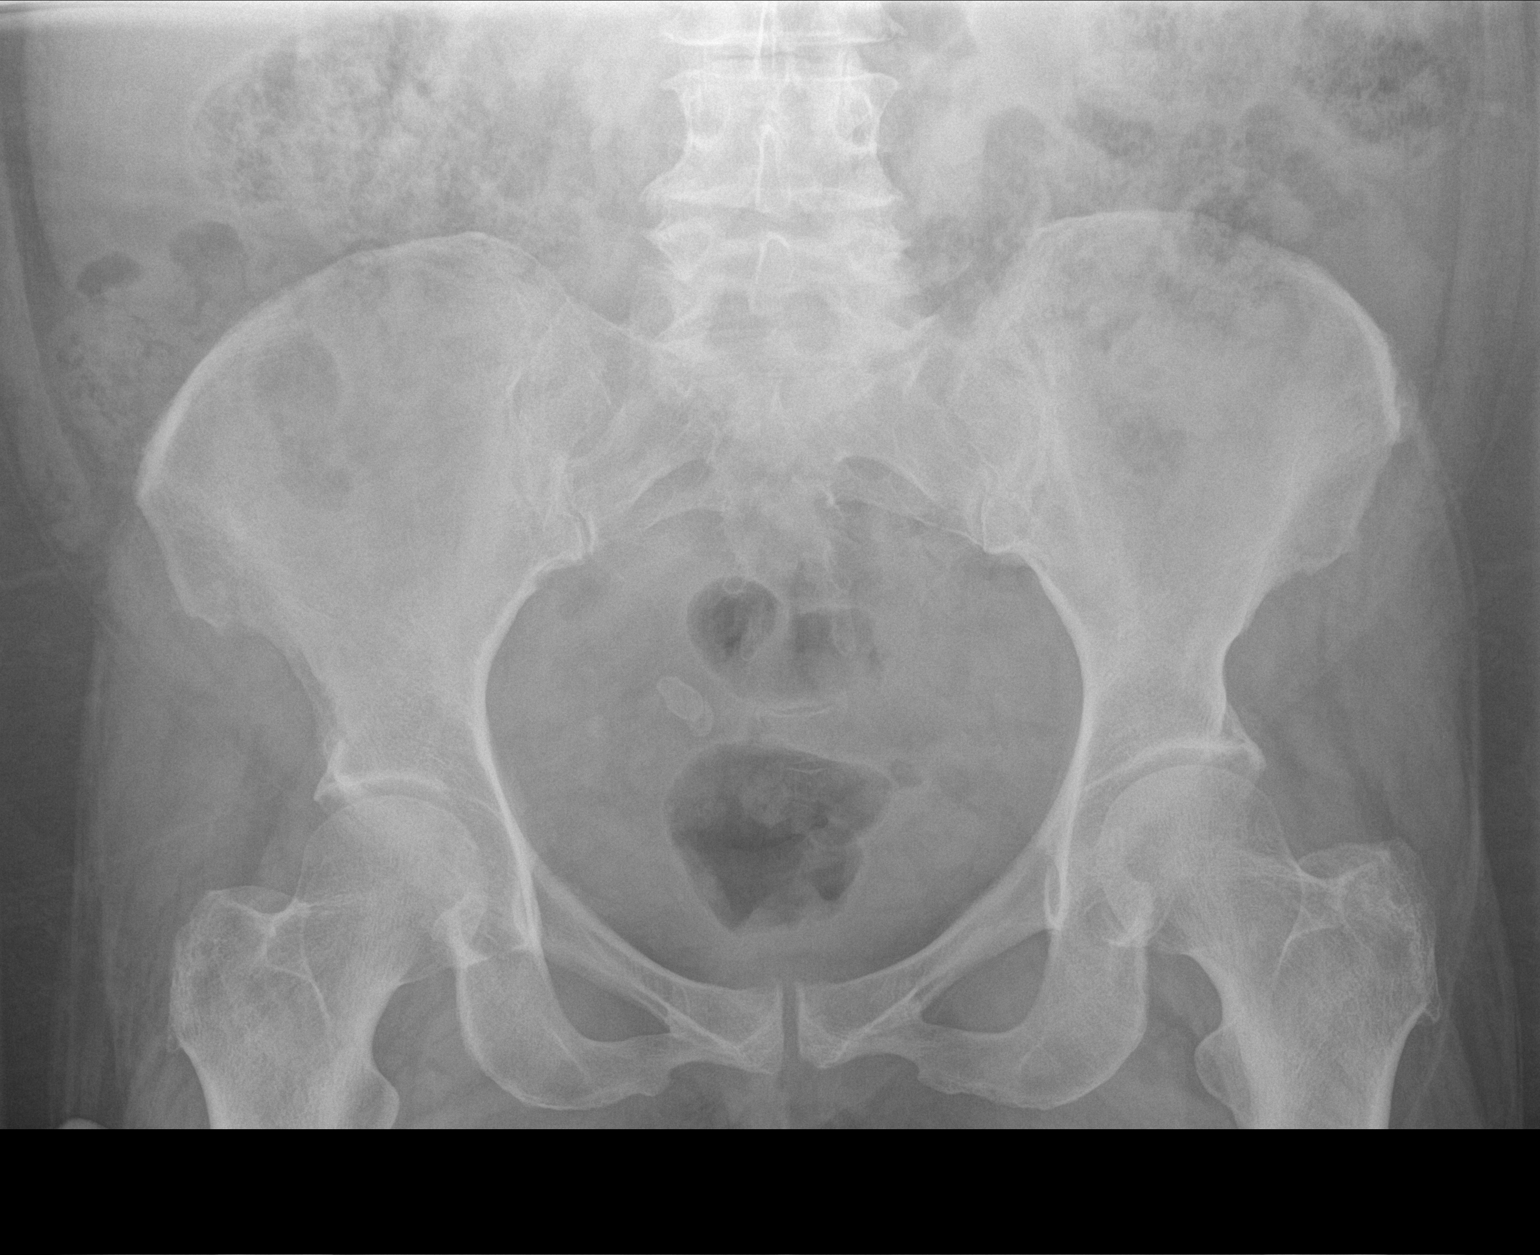

[2 of 2 positions shown; findings below may reference images not displayed]

FINDINGS: Gas and stool in the colon. No small or large bowel distention. No
radiopaque stones identified. Calcifications in the pelvis are
probably phleboliths. Degenerative changes in the spine and hips.
IMPRESSION: Normal nonobstructive bowel gas pattern.

## 2017-03-06 DIAGNOSIS — I10 Essential (primary) hypertension: Secondary | ICD-10-CM | POA: Diagnosis not present

## 2017-03-06 DIAGNOSIS — E1159 Type 2 diabetes mellitus with other circulatory complications: Secondary | ICD-10-CM | POA: Diagnosis not present

## 2017-03-06 DIAGNOSIS — E119 Type 2 diabetes mellitus without complications: Secondary | ICD-10-CM | POA: Diagnosis not present

## 2017-03-06 DIAGNOSIS — E559 Vitamin D deficiency, unspecified: Secondary | ICD-10-CM | POA: Diagnosis not present

## 2017-03-06 DIAGNOSIS — R000 Tachycardia, unspecified: Secondary | ICD-10-CM | POA: Diagnosis not present

## 2017-03-06 DIAGNOSIS — R7989 Other specified abnormal findings of blood chemistry: Secondary | ICD-10-CM | POA: Diagnosis not present

## 2017-03-06 DIAGNOSIS — Z79899 Other long term (current) drug therapy: Secondary | ICD-10-CM | POA: Diagnosis not present

## 2017-03-06 DIAGNOSIS — M19012 Primary osteoarthritis, left shoulder: Secondary | ICD-10-CM | POA: Diagnosis not present

## 2017-03-06 DIAGNOSIS — G4733 Obstructive sleep apnea (adult) (pediatric): Secondary | ICD-10-CM | POA: Diagnosis not present

## 2017-04-11 DIAGNOSIS — E559 Vitamin D deficiency, unspecified: Secondary | ICD-10-CM | POA: Diagnosis not present

## 2017-04-11 DIAGNOSIS — R7989 Other specified abnormal findings of blood chemistry: Secondary | ICD-10-CM | POA: Diagnosis not present

## 2017-04-11 DIAGNOSIS — E119 Type 2 diabetes mellitus without complications: Secondary | ICD-10-CM | POA: Diagnosis not present

## 2017-06-05 DIAGNOSIS — G4733 Obstructive sleep apnea (adult) (pediatric): Secondary | ICD-10-CM | POA: Diagnosis not present

## 2017-06-05 DIAGNOSIS — E1159 Type 2 diabetes mellitus with other circulatory complications: Secondary | ICD-10-CM | POA: Diagnosis not present

## 2017-06-05 DIAGNOSIS — E119 Type 2 diabetes mellitus without complications: Secondary | ICD-10-CM | POA: Diagnosis not present

## 2017-06-05 DIAGNOSIS — E559 Vitamin D deficiency, unspecified: Secondary | ICD-10-CM | POA: Diagnosis not present

## 2017-06-11 DIAGNOSIS — G4733 Obstructive sleep apnea (adult) (pediatric): Secondary | ICD-10-CM | POA: Diagnosis not present

## 2017-06-18 DIAGNOSIS — I10 Essential (primary) hypertension: Secondary | ICD-10-CM | POA: Diagnosis not present

## 2017-06-18 DIAGNOSIS — E119 Type 2 diabetes mellitus without complications: Secondary | ICD-10-CM | POA: Diagnosis not present

## 2017-06-18 DIAGNOSIS — Z6841 Body Mass Index (BMI) 40.0 and over, adult: Secondary | ICD-10-CM | POA: Diagnosis not present

## 2017-06-26 DIAGNOSIS — D72829 Elevated white blood cell count, unspecified: Secondary | ICD-10-CM | POA: Diagnosis not present

## 2017-06-26 DIAGNOSIS — E1159 Type 2 diabetes mellitus with other circulatory complications: Secondary | ICD-10-CM | POA: Diagnosis not present

## 2017-06-26 DIAGNOSIS — I10 Essential (primary) hypertension: Secondary | ICD-10-CM | POA: Diagnosis not present

## 2017-06-26 DIAGNOSIS — Z713 Dietary counseling and surveillance: Secondary | ICD-10-CM | POA: Diagnosis not present

## 2017-06-26 DIAGNOSIS — Z5181 Encounter for therapeutic drug level monitoring: Secondary | ICD-10-CM | POA: Diagnosis not present

## 2017-06-26 DIAGNOSIS — Z6841 Body Mass Index (BMI) 40.0 and over, adult: Secondary | ICD-10-CM | POA: Diagnosis not present

## 2017-06-26 DIAGNOSIS — E119 Type 2 diabetes mellitus without complications: Secondary | ICD-10-CM | POA: Diagnosis not present

## 2017-06-26 DIAGNOSIS — G4733 Obstructive sleep apnea (adult) (pediatric): Secondary | ICD-10-CM | POA: Diagnosis not present

## 2017-06-28 DIAGNOSIS — G4733 Obstructive sleep apnea (adult) (pediatric): Secondary | ICD-10-CM | POA: Diagnosis not present

## 2017-06-28 DIAGNOSIS — Z01818 Encounter for other preprocedural examination: Secondary | ICD-10-CM | POA: Diagnosis not present

## 2017-06-28 DIAGNOSIS — Z6841 Body Mass Index (BMI) 40.0 and over, adult: Secondary | ICD-10-CM | POA: Diagnosis not present

## 2017-06-28 DIAGNOSIS — I10 Essential (primary) hypertension: Secondary | ICD-10-CM | POA: Diagnosis not present

## 2017-06-28 DIAGNOSIS — K210 Gastro-esophageal reflux disease with esophagitis: Secondary | ICD-10-CM | POA: Diagnosis not present

## 2017-06-28 DIAGNOSIS — E119 Type 2 diabetes mellitus without complications: Secondary | ICD-10-CM | POA: Diagnosis not present

## 2017-06-28 DIAGNOSIS — E1159 Type 2 diabetes mellitus with other circulatory complications: Secondary | ICD-10-CM | POA: Diagnosis not present

## 2017-06-28 DIAGNOSIS — K449 Diaphragmatic hernia without obstruction or gangrene: Secondary | ICD-10-CM | POA: Diagnosis not present

## 2017-07-11 DIAGNOSIS — G4733 Obstructive sleep apnea (adult) (pediatric): Secondary | ICD-10-CM | POA: Diagnosis not present

## 2017-07-23 DIAGNOSIS — Z6841 Body Mass Index (BMI) 40.0 and over, adult: Secondary | ICD-10-CM | POA: Diagnosis not present

## 2017-07-23 DIAGNOSIS — Z713 Dietary counseling and surveillance: Secondary | ICD-10-CM | POA: Diagnosis not present

## 2017-07-25 DIAGNOSIS — H40053 Ocular hypertension, bilateral: Secondary | ICD-10-CM | POA: Diagnosis not present

## 2017-07-25 DIAGNOSIS — E119 Type 2 diabetes mellitus without complications: Secondary | ICD-10-CM | POA: Diagnosis not present

## 2017-07-25 DIAGNOSIS — Z7984 Long term (current) use of oral hypoglycemic drugs: Secondary | ICD-10-CM | POA: Diagnosis not present

## 2017-07-25 DIAGNOSIS — H2513 Age-related nuclear cataract, bilateral: Secondary | ICD-10-CM | POA: Diagnosis not present

## 2017-07-26 DIAGNOSIS — Z6841 Body Mass Index (BMI) 40.0 and over, adult: Secondary | ICD-10-CM | POA: Diagnosis not present

## 2017-08-11 DIAGNOSIS — G4733 Obstructive sleep apnea (adult) (pediatric): Secondary | ICD-10-CM | POA: Diagnosis not present

## 2017-09-06 DIAGNOSIS — Z6841 Body Mass Index (BMI) 40.0 and over, adult: Secondary | ICD-10-CM | POA: Diagnosis not present

## 2017-09-06 DIAGNOSIS — I10 Essential (primary) hypertension: Secondary | ICD-10-CM | POA: Diagnosis not present

## 2017-09-06 DIAGNOSIS — E119 Type 2 diabetes mellitus without complications: Secondary | ICD-10-CM | POA: Diagnosis not present

## 2017-09-06 DIAGNOSIS — E1159 Type 2 diabetes mellitus with other circulatory complications: Secondary | ICD-10-CM | POA: Diagnosis not present

## 2017-09-06 DIAGNOSIS — Z0000 Encounter for general adult medical examination without abnormal findings: Secondary | ICD-10-CM | POA: Diagnosis not present

## 2017-09-10 DIAGNOSIS — G4733 Obstructive sleep apnea (adult) (pediatric): Secondary | ICD-10-CM | POA: Diagnosis not present

## 2017-10-02 DIAGNOSIS — E119 Type 2 diabetes mellitus without complications: Secondary | ICD-10-CM | POA: Diagnosis not present

## 2017-10-02 DIAGNOSIS — Z0000 Encounter for general adult medical examination without abnormal findings: Secondary | ICD-10-CM | POA: Diagnosis not present

## 2017-10-09 DIAGNOSIS — Z6841 Body Mass Index (BMI) 40.0 and over, adult: Secondary | ICD-10-CM | POA: Diagnosis not present

## 2017-10-09 DIAGNOSIS — E119 Type 2 diabetes mellitus without complications: Secondary | ICD-10-CM | POA: Diagnosis not present

## 2017-10-11 DIAGNOSIS — G4733 Obstructive sleep apnea (adult) (pediatric): Secondary | ICD-10-CM | POA: Diagnosis not present

## 2017-11-06 DIAGNOSIS — Z6841 Body Mass Index (BMI) 40.0 and over, adult: Secondary | ICD-10-CM | POA: Diagnosis not present

## 2017-11-11 DIAGNOSIS — G4733 Obstructive sleep apnea (adult) (pediatric): Secondary | ICD-10-CM | POA: Diagnosis not present

## 2017-11-22 DIAGNOSIS — H43393 Other vitreous opacities, bilateral: Secondary | ICD-10-CM | POA: Diagnosis not present

## 2017-11-22 DIAGNOSIS — H43811 Vitreous degeneration, right eye: Secondary | ICD-10-CM | POA: Diagnosis not present

## 2017-12-11 DIAGNOSIS — G4733 Obstructive sleep apnea (adult) (pediatric): Secondary | ICD-10-CM | POA: Diagnosis not present

## 2018-01-01 DIAGNOSIS — I10 Essential (primary) hypertension: Secondary | ICD-10-CM | POA: Diagnosis not present

## 2018-01-01 DIAGNOSIS — Z6841 Body Mass Index (BMI) 40.0 and over, adult: Secondary | ICD-10-CM | POA: Diagnosis not present

## 2018-01-01 DIAGNOSIS — E1159 Type 2 diabetes mellitus with other circulatory complications: Secondary | ICD-10-CM | POA: Diagnosis not present

## 2018-01-01 DIAGNOSIS — Z0000 Encounter for general adult medical examination without abnormal findings: Secondary | ICD-10-CM | POA: Diagnosis not present

## 2018-01-11 DIAGNOSIS — G4733 Obstructive sleep apnea (adult) (pediatric): Secondary | ICD-10-CM | POA: Diagnosis not present

## 2018-01-24 DIAGNOSIS — H25013 Cortical age-related cataract, bilateral: Secondary | ICD-10-CM | POA: Diagnosis not present

## 2018-01-24 DIAGNOSIS — H40053 Ocular hypertension, bilateral: Secondary | ICD-10-CM | POA: Diagnosis not present

## 2018-01-24 DIAGNOSIS — H2513 Age-related nuclear cataract, bilateral: Secondary | ICD-10-CM | POA: Diagnosis not present

## 2018-02-10 DIAGNOSIS — G4733 Obstructive sleep apnea (adult) (pediatric): Secondary | ICD-10-CM | POA: Diagnosis not present

## 2018-02-12 DIAGNOSIS — G4733 Obstructive sleep apnea (adult) (pediatric): Secondary | ICD-10-CM | POA: Diagnosis not present

## 2018-02-13 DIAGNOSIS — G4733 Obstructive sleep apnea (adult) (pediatric): Secondary | ICD-10-CM | POA: Diagnosis not present

## 2018-02-19 DIAGNOSIS — H43811 Vitreous degeneration, right eye: Secondary | ICD-10-CM | POA: Diagnosis not present

## 2018-02-19 DIAGNOSIS — H40053 Ocular hypertension, bilateral: Secondary | ICD-10-CM | POA: Diagnosis not present

## 2018-02-19 DIAGNOSIS — H25011 Cortical age-related cataract, right eye: Secondary | ICD-10-CM | POA: Diagnosis not present

## 2018-02-19 DIAGNOSIS — E119 Type 2 diabetes mellitus without complications: Secondary | ICD-10-CM | POA: Diagnosis not present

## 2018-02-19 DIAGNOSIS — H25013 Cortical age-related cataract, bilateral: Secondary | ICD-10-CM | POA: Diagnosis not present

## 2018-02-19 DIAGNOSIS — H5203 Hypermetropia, bilateral: Secondary | ICD-10-CM | POA: Diagnosis not present

## 2018-02-19 DIAGNOSIS — Z7984 Long term (current) use of oral hypoglycemic drugs: Secondary | ICD-10-CM | POA: Diagnosis not present

## 2018-02-19 DIAGNOSIS — H2511 Age-related nuclear cataract, right eye: Secondary | ICD-10-CM | POA: Diagnosis not present

## 2018-02-19 DIAGNOSIS — H2513 Age-related nuclear cataract, bilateral: Secondary | ICD-10-CM | POA: Diagnosis not present

## 2018-02-19 DIAGNOSIS — H52203 Unspecified astigmatism, bilateral: Secondary | ICD-10-CM | POA: Diagnosis not present

## 2018-02-19 DIAGNOSIS — H524 Presbyopia: Secondary | ICD-10-CM | POA: Diagnosis not present

## 2018-03-15 DIAGNOSIS — G4733 Obstructive sleep apnea (adult) (pediatric): Secondary | ICD-10-CM | POA: Diagnosis not present

## 2018-03-16 DIAGNOSIS — G4733 Obstructive sleep apnea (adult) (pediatric): Secondary | ICD-10-CM | POA: Diagnosis not present

## 2018-03-27 DIAGNOSIS — Z6841 Body Mass Index (BMI) 40.0 and over, adult: Secondary | ICD-10-CM | POA: Diagnosis not present

## 2018-03-27 DIAGNOSIS — G4733 Obstructive sleep apnea (adult) (pediatric): Secondary | ICD-10-CM | POA: Diagnosis not present

## 2018-03-27 DIAGNOSIS — E1159 Type 2 diabetes mellitus with other circulatory complications: Secondary | ICD-10-CM | POA: Diagnosis not present

## 2018-03-27 DIAGNOSIS — Z9189 Other specified personal risk factors, not elsewhere classified: Secondary | ICD-10-CM | POA: Diagnosis not present

## 2018-03-27 DIAGNOSIS — H25011 Cortical age-related cataract, right eye: Secondary | ICD-10-CM | POA: Diagnosis not present

## 2018-03-27 DIAGNOSIS — I10 Essential (primary) hypertension: Secondary | ICD-10-CM | POA: Diagnosis not present

## 2018-03-27 DIAGNOSIS — Z5181 Encounter for therapeutic drug level monitoring: Secondary | ICD-10-CM | POA: Diagnosis not present

## 2018-03-27 DIAGNOSIS — Z0000 Encounter for general adult medical examination without abnormal findings: Secondary | ICD-10-CM | POA: Diagnosis not present

## 2018-03-27 DIAGNOSIS — E119 Type 2 diabetes mellitus without complications: Secondary | ICD-10-CM | POA: Diagnosis not present

## 2018-04-08 DIAGNOSIS — G4733 Obstructive sleep apnea (adult) (pediatric): Secondary | ICD-10-CM | POA: Diagnosis not present

## 2018-04-14 DIAGNOSIS — G4733 Obstructive sleep apnea (adult) (pediatric): Secondary | ICD-10-CM | POA: Diagnosis not present

## 2018-04-26 DIAGNOSIS — Z6841 Body Mass Index (BMI) 40.0 and over, adult: Secondary | ICD-10-CM | POA: Diagnosis not present

## 2018-05-13 DIAGNOSIS — Z713 Dietary counseling and surveillance: Secondary | ICD-10-CM | POA: Diagnosis not present

## 2018-05-15 DIAGNOSIS — G4733 Obstructive sleep apnea (adult) (pediatric): Secondary | ICD-10-CM | POA: Diagnosis not present

## 2018-06-19 DIAGNOSIS — Z713 Dietary counseling and surveillance: Secondary | ICD-10-CM | POA: Diagnosis not present

## 2018-06-28 DIAGNOSIS — E119 Type 2 diabetes mellitus without complications: Secondary | ICD-10-CM | POA: Diagnosis not present

## 2018-06-28 DIAGNOSIS — Z0000 Encounter for general adult medical examination without abnormal findings: Secondary | ICD-10-CM | POA: Diagnosis not present

## 2018-06-28 DIAGNOSIS — Z5181 Encounter for therapeutic drug level monitoring: Secondary | ICD-10-CM | POA: Diagnosis not present

## 2018-09-24 DIAGNOSIS — Z79899 Other long term (current) drug therapy: Secondary | ICD-10-CM | POA: Diagnosis not present

## 2018-09-24 DIAGNOSIS — E119 Type 2 diabetes mellitus without complications: Secondary | ICD-10-CM | POA: Diagnosis not present

## 2018-09-24 DIAGNOSIS — N760 Acute vaginitis: Secondary | ICD-10-CM | POA: Diagnosis not present

## 2018-09-24 DIAGNOSIS — Z5181 Encounter for therapeutic drug level monitoring: Secondary | ICD-10-CM | POA: Diagnosis not present

## 2018-10-02 DIAGNOSIS — Z6841 Body Mass Index (BMI) 40.0 and over, adult: Secondary | ICD-10-CM | POA: Diagnosis not present

## 2018-10-02 DIAGNOSIS — Z0000 Encounter for general adult medical examination without abnormal findings: Secondary | ICD-10-CM | POA: Diagnosis not present

## 2018-10-02 DIAGNOSIS — I10 Essential (primary) hypertension: Secondary | ICD-10-CM | POA: Diagnosis not present

## 2018-10-02 DIAGNOSIS — E1159 Type 2 diabetes mellitus with other circulatory complications: Secondary | ICD-10-CM | POA: Diagnosis not present

## 2018-10-02 DIAGNOSIS — R7989 Other specified abnormal findings of blood chemistry: Secondary | ICD-10-CM | POA: Diagnosis not present

## 2018-10-02 DIAGNOSIS — E559 Vitamin D deficiency, unspecified: Secondary | ICD-10-CM | POA: Diagnosis not present

## 2018-10-02 DIAGNOSIS — N760 Acute vaginitis: Secondary | ICD-10-CM | POA: Diagnosis not present

## 2018-10-02 DIAGNOSIS — G4733 Obstructive sleep apnea (adult) (pediatric): Secondary | ICD-10-CM | POA: Diagnosis not present

## 2018-10-02 DIAGNOSIS — E1165 Type 2 diabetes mellitus with hyperglycemia: Secondary | ICD-10-CM | POA: Diagnosis not present

## 2018-10-02 DIAGNOSIS — E538 Deficiency of other specified B group vitamins: Secondary | ICD-10-CM | POA: Diagnosis not present

## 2018-10-28 DIAGNOSIS — N763 Subacute and chronic vulvitis: Secondary | ICD-10-CM | POA: Diagnosis not present

## 2018-10-28 DIAGNOSIS — E669 Obesity, unspecified: Secondary | ICD-10-CM | POA: Diagnosis not present

## 2018-10-28 DIAGNOSIS — Z124 Encounter for screening for malignant neoplasm of cervix: Secondary | ICD-10-CM | POA: Diagnosis not present

## 2018-10-28 DIAGNOSIS — Z01419 Encounter for gynecological examination (general) (routine) without abnormal findings: Secondary | ICD-10-CM | POA: Diagnosis not present

## 2018-10-30 DIAGNOSIS — Z1231 Encounter for screening mammogram for malignant neoplasm of breast: Secondary | ICD-10-CM | POA: Diagnosis not present

## 2018-10-30 DIAGNOSIS — R7989 Other specified abnormal findings of blood chemistry: Secondary | ICD-10-CM | POA: Diagnosis not present

## 2018-11-20 DIAGNOSIS — N6321 Unspecified lump in the left breast, upper outer quadrant: Secondary | ICD-10-CM | POA: Diagnosis not present

## 2018-11-20 DIAGNOSIS — R928 Other abnormal and inconclusive findings on diagnostic imaging of breast: Secondary | ICD-10-CM | POA: Diagnosis not present

## 2018-11-20 DIAGNOSIS — N6011 Diffuse cystic mastopathy of right breast: Secondary | ICD-10-CM | POA: Diagnosis not present

## 2018-12-05 DIAGNOSIS — K7460 Unspecified cirrhosis of liver: Secondary | ICD-10-CM | POA: Diagnosis not present

## 2018-12-05 DIAGNOSIS — R1011 Right upper quadrant pain: Secondary | ICD-10-CM | POA: Diagnosis not present

## 2018-12-12 DIAGNOSIS — K7460 Unspecified cirrhosis of liver: Secondary | ICD-10-CM | POA: Diagnosis not present

## 2019-01-02 DIAGNOSIS — E1165 Type 2 diabetes mellitus with hyperglycemia: Secondary | ICD-10-CM | POA: Diagnosis not present

## 2019-01-02 DIAGNOSIS — R748 Abnormal levels of other serum enzymes: Secondary | ICD-10-CM | POA: Diagnosis not present

## 2019-01-02 DIAGNOSIS — K7460 Unspecified cirrhosis of liver: Secondary | ICD-10-CM | POA: Diagnosis not present

## 2019-01-02 DIAGNOSIS — Z5181 Encounter for therapeutic drug level monitoring: Secondary | ICD-10-CM | POA: Diagnosis not present

## 2019-05-01 ENCOUNTER — Other Ambulatory Visit: Payer: Self-pay | Admitting: *Deleted

## 2019-05-01 NOTE — Patient Outreach (Signed)
  [facility]) Care Management Chronic Special Needs Program    05/01/2019  Name: Audra Kagel, DOB: 04-Jan-1946  MRN: [id number]   Ms. Eliah Ozawa is enrolled in a chronic special needs plan for Diabetes.  A completed health risk assessment has been received from the client and client has responded to outreach attempts by their health care concierge.   The client's individualized care plan was developed based on available data.  Plan:   Send unsuccessful outreach letter with a copy of individualized care plan to client  Send individualized care plan to provider   Chronic care management coordinator, Irving Shows [phone]), will attempt outreach in 2-4 months.  Francis Dowse, RN, MSN, CCM, CNS [facility] Management Clinical Nurse Specialist Office Number [phone]

## 2019-05-26 ENCOUNTER — Other Ambulatory Visit: Payer: Self-pay | Admitting: *Deleted

## 2019-05-26 NOTE — Patient Outreach (Signed)
  [facility]) Care Management Chronic Special Needs Program    05/26/2019  Name: Regina Lee, DOB: Jun 22, 1945  MRN: [id number]   Ms. Shantai Tiedeman is enrolled in a chronic special needs plan for Diabetes.  Client called 24 hour nurse advice line number with the following issues: Has medication questions and cannot afford medications, CBG readings are in 200's and client feels tired.  RN care manager outreached client by telephone and no answer to telephone, left voicemail requesting return phone call.  PLAN Outreach client next week  Irving Shows [facility], BSN [facility] Care Coordinator [phone]

## 2019-06-02 ENCOUNTER — Other Ambulatory Visit: Payer: Self-pay | Admitting: *Deleted

## 2019-06-02 NOTE — Patient Outreach (Signed)
  [facility]) Care Management Chronic Special Needs Program    06/02/2019  Name: Regina Lee, DOB: 28-Jan-1946  MRN: [id number]   [name] is enrolled in a chronic special needs plan for Diabetes.  Outreach call to client for follow up on 24 hour nurse advice line and initial telephone assessment, no answer to telephone, left voicemail requesting return phone call.  PLAN Outreach client in 2 weeks  Irving Shows [facility], BSN [facility] Care Coordinator [phone]

## 2019-06-10 ENCOUNTER — Other Ambulatory Visit: Payer: Self-pay | Admitting: *Deleted

## 2019-06-10 NOTE — Patient Outreach (Signed)
  [facility]) Care Management Chronic Special Needs Program    06/10/2019  Name: Regina Lee, DOB: 1945/04/28  MRN: [id number]    Regina Lee is enrolled in a chronic special needs plan for Diabetes.  Outreach call to client for initial telephone assessment and follow up 24 hour nurse advice line/ 3rd attempt, no answer to telephone , left voicemail requesting return phone call.  RN care manager mailed unsuccessful outreach letter to client's home.  PLAN Outreach client in 6 months  Irving Shows [facility], BSN [facility] Care Coordinator [phone]

## 2019-06-24 ENCOUNTER — Ambulatory Visit: Payer: PPO | Admitting: *Deleted

## 2019-10-10 ENCOUNTER — Ambulatory Visit: Payer: Self-pay | Admitting: *Deleted

## 2019-10-21 ENCOUNTER — Ambulatory Visit: Payer: Self-pay | Admitting: *Deleted

## 2019-10-24 ENCOUNTER — Encounter: Payer: Self-pay | Admitting: *Deleted

## 2019-10-24 ENCOUNTER — Other Ambulatory Visit: Payer: Self-pay | Admitting: *Deleted

## 2019-10-24 NOTE — Patient Outreach (Signed)
[facility]) Care Management Chronic Special Needs Program  10/24/2019  Name: Regina Lee DOB: 1945-12-31  MRN: [id number]  Regina Lee is enrolled in a chronic special needs plan for Diabetes. Chronic Care Management Coordinator telephoned client to review health risk assessment and to develop individualized care plan.  Introduced the chronic care management program, importance of client participation, and taking their care plan to all provider appointments and inpatient facilities.  Reviewed the transition of care process and possible referral to community care management.  Subjective:  Client reports she lives with her spouse, is independent with all aspects of her care, still drives.  Client states she has tele-visit with primary care provider next week and will have labwork/ AIC done this weekend at the lab before this visit.  Client states she hopes to be "put back on victoza as this is what really helped my blood sugar and AIC was good" Client states she initially could afford victoza and "then went in the donut hole and it was 700$"  Client states hopefully she is out of donut hole and will be able to afford, states if there are any issues she will let RN care manager know. Client reports she does not do much exercise and would like to lose weight but declines health coach at present but may reconsider in the future, client states she has seen a dietician in the past for diabetic teaching/ meal planning.  Client feels she knows what to do, it is just a matter of doing it (diet, etc.).  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 Lanae Boast.  Client is to have eye exam in September.    Goals Addressed              This Visit's Progress   .  "to lose 50 pounds" (pt-stated)        Try to walk some each day Practice portion control Talk to your doctor about an exercise program RN  care manager discussed health coaching program, please let RN care manager know if you are interested for weight loss RN care manager provided EMMI education article "Weight loss tips"     .  Client understands the importance of follow-up with providers by attending scheduled visits        Review of medical record indicates client completed 8 visits with provider in 2020 and 2 in 2021. Continue to keep all follow up appointments with your provdier.        .  COMPLETED: Client will use Assistive Devices as needed and verbalize understanding of device use        Learn how to use all assistive devices including your glucose meter the right way. Contact your assigned care manager with any questions regarding your assistive devices.   Client reports she is independent with using glucometer.    .  Client will verbalize knowledge of self management of Hypertension as evidences by BP reading of 140/90 or less; or as defined by provider        Plan to check blood pressure regularly.  If you do not have a B/P monitor (cuff), one can be provided to you.  Write results in your [employer] calendar (in the back section). Reviewed blood pressure medication from EMR. Take B/P medications as ordered.  Some may cause you to use the bathroom more. Plan to eat low salt and heart  healthy meals full of fruits, vegetables, whole grains, lean protein and limit fat and sugars. Increase activity as tolerated. Reviewed lifestyle modification- smoking cessation, weight control and reducing stress.     Marland Kitchen  HEMOGLOBIN A1C < 7        Your last documented AIC is 11.4 on 09/24/18  .  Have your [facility] checked every 6 months if you are at goal or every 3 months if you are not at goal. Check blood sugars daily before eating with goal of 80-130.  You can also check 1 1/2 hours after eating with goal of 180 or less. Plan to eat low carbohydrate and low salt meals, watch portion sizes and avoid sugar sweetened drinks.   Discussed carbohydrate control meals. Reviewed signs and symptoms of hyperglycemia (high blood sugar) and hypoglycemia (low blood sugar) and actions to take. Review [employer] calendar (sent in the mail) for diabetes action plan in the back. Reviewed nutrition counseling benefit provided by [employer].  Increase activity only if you are able to do it.  Follow doctor recommendations. EMMI education provided on "Diabetes and diet"  "Diabetic meal planning".  Review and plan to discuss with RN during next telephonic assessment.  Use 24 hour nurse advice line as needed at [phone]      .  Maintain timely refills of diabetic medication as prescribed within the year .        Contact your RN care manager if you have questions about medicines. Medication review completed from EMR information. It is important to take your medications as prescribed. Reviewed use and possible side effects of diabetes medications.       .  Obtain annual  Lipid Profile, LDL-C        Unable to determine per medical record review when last lipid profile completed The goal for LDL is less than 70mg /dl as you are at high risk for complications. Try to avoid saturated fats, trans-fats and eat more fiber.        .  Obtain Annual Eye (retinal)  Exam         Unable to determine last documented eye exam, client reports she is to have eye exam on 11/03/19. Diabetes can affect your vision.  Plan to have a dilated eye exam every year. Advised client to keep and/ or schedule appointment with eye doctor.       .  Obtain Annual Foot Exam        Your doctor should check your bare feet at each visit. Diabetes can affect the nerves in your feet, causing decreased feeling or numbness. Check your feet and in-between toes daily for cuts, bruises, redness, blisters or sores.  If you cannot reach them, use a mirror. Wash feet with soap and water, dry feet well especially between toes.  Don't use too  much lotion. Wear shoes that are not too tight and don't walk barefoot.      .  Obtain annual screen for micro albuminuria (urine) , nephropathy (kidney problems)        Diabetes can affect your kidneys. It is important for your doctor to check your urine at least once a year  These tests show how your kidneys are working.      11/05/19 Hemoglobin A1C at least 2 times per year        Review of medical record - A1C 11.4 - 09/24/2018 Continue to keep all scheduled medical appointments and lab work as recommended by  your provider. Client reports she is to have labwork/ AIC checked in the next few days ahead of tele-visit with primary care provider next week (week of 10/27/19)        Plan:    RN care manager faxed today's note with updated individualized care plan to primary care provider, mailed updated individualized care plan to client along with education materials, consent form, HTA calendar, 24 hour nurse advice line magnet.  Chronic care management coordination will outreach in:  6 months    Audrie Gallus Nursing/RN Coord [facility] Case Manager, C-SNP  [phone]

## 2019-10-29 ENCOUNTER — Ambulatory Visit: Payer: Medicare Other | Admitting: *Deleted

## 2019-11-03 ENCOUNTER — Ambulatory Visit: Payer: Self-pay | Admitting: *Deleted

## 2020-01-02 ENCOUNTER — Other Ambulatory Visit: Payer: Self-pay | Admitting: *Deleted

## 2020-01-02 NOTE — Patient Outreach (Signed)
  [facility]) Care Management Chronic Special Needs Program    01/02/2020  Name: Regina Lee, DOB: 03-11-1945  MRN: [id number]   [name] is enrolled in a chronic special needs plan for Diabetes.  [employer] care management team has assumed care and services for this member.  Case closed by [facility] care management.   Irving Shows [facility], BSN [facility] RN Care Coordinator, CSNP [phone]

## 2021-07-27 ENCOUNTER — Emergency Department (INDEPENDENT_AMBULATORY_CARE_PROVIDER_SITE_OTHER)
Admission: EM | Admit: 2021-07-27 | Discharge: 2021-07-27 | Disposition: A | Discharge status: Home / Self Care | Payer: HMO | Source: Home / Self Care | Attending: Family Medicine | Admitting: Family Medicine

## 2021-07-27 ENCOUNTER — Encounter: Payer: Self-pay | Admitting: Emergency Medicine

## 2021-07-27 DIAGNOSIS — L250 Unspecified contact dermatitis due to cosmetics: Secondary | ICD-10-CM

## 2021-07-27 MED ORDER — TRIAMCINOLONE ACETONIDE 0.1 % EX OINT: 1.0000 "application " | TOPICAL_OINTMENT | Freq: Two times a day (BID) | CUTANEOUS | 1 refills | Status: AC

## 2021-07-27 NOTE — ED Triage Notes (Signed)
Rash to face - started 2 weeks ago  Died hair the week before Burns at times  OTC  Neutrogena scrub x 10 days ago - last used Sunday  Aloe gel to face

## 2021-07-27 NOTE — ED Provider Notes (Signed)
[facility]    CSN: [id number] Arrival date & time: 07/27/21  1924      History   Chief Complaint Chief Complaint  Patient presents with   Rash    HPI Regina Lee is a 76 y.o. female.   HPI  76 year old woman.  [facility]  She has well-controlled diabetes with her most recent hemoglobin A1c of 7.4.  She dyed her hair 3 weeks ago, she used a Neutrogena scrub a couple weeks ago, now she has a rash on her face.  Of note there is a telephone record dated 6-08/2021 where she called her internist to see about a dermatology referral for this rash.  At that time its been present for 2 weeks.  She is here today because it has failed to clear.  It has a burning sensation. She has been using harsh soaps and scrubs because she feels like there is something on top of the skin.  She has been using witch hazel, aloe vera, exfoliating scrub, and Dial soap.  Past Medical History:  Diagnosis Date   Diabetes mellitus without complication (HCC)    Hypertension     There are no problems to display for this patient.   History reviewed. No pertinent surgical history.  OB History   No obstetric history on file.      Home Medications    Prior to Admission medications   Medication Sig Start Date End Date Taking? Authorizing Provider  glucose blood (ONETOUCH VERIO) test strip Use One Touch Ultra Test strips to check blood sugar twice daily. E11.65 12/07/20  Yes [provider]  Semaglutide, 1 MG/DOSE, 4 MG/3ML SOPN Inject 1 mg into the skin once a week. 06/24/21  Yes [provider]  triamcinolone ointment (KENALOG) 0.1 % Apply 1 application  topically 2 (two) times daily. 07/27/21  Yes Eustace MooreNelson, Kami Kube Sue, MD  calcium-vitamin D (OSCAL WITH D) 500-200 MG-UNIT tablet Take 1 tablet by mouth.    [provider]  chlorthalidone (HYGROTON) 25 MG tablet Take 25 mg by mouth daily.    [provider]  cyanocobalamin 100  MCG tablet Take 100 mcg by mouth daily.    [provider]  glipiZIDE (GLUCOTROL) 5 MG tablet Take 20 mg by mouth daily before breakfast.     [provider]  metFORMIN (GLUCOPHAGE) 500 MG tablet Take by mouth daily with breakfast.    [provider]    Family History Family History  Problem Relation Age of Onset   Diabetes Mother    Cancer Father    Diabetes Father     Social History Social History   Tobacco Use   Smoking status: Never   Smokeless tobacco: Never  Substance Use Topics   Alcohol use: No   Drug use: No     Allergies   Gabapentin   Review of Systems Review of Systems  See HPI Physical Exam Triage Vital Signs ED Triage Vitals  Enc Vitals Group     BP 07/27/21 1933 (!) 144/82     Pulse Rate 07/27/21 1933 (!) 105     Resp 07/27/21 1933 18     Temp 07/27/21 1933 99.4 F (37.4 C)     Temp Source 07/27/21 1933 Oral     SpO2 07/27/21 1933 96 %     Weight 07/27/21 1934 240 lb (108.9 kg)     Height 07/27/21 1934 5\' 2"  (1.575 m)     Head Circumference --  Peak Flow --      Pain Score 07/27/21 1934 3     Pain Loc --      Pain Edu? --      Excl. in GC? --    No data found.  Updated Vital Signs BP (!) 144/82 (BP Location: Left Arm)   Pulse (!) 105   Temp 99.4 F (37.4 C) (Oral)   Resp 18   Ht 5\' 2"  (1.575 m)   Wt 108.9 kg   SpO2 96%   BMI 43.90 kg/m        Physical Exam Constitutional:      General: She is not in acute distress.    Appearance: She is well-developed. She is obese. She is not ill-appearing.  HENT:     Head: Normocephalic and atraumatic.   Eyes:     Conjunctiva/sclera: Conjunctivae normal.     Pupils: Pupils are equal, round, and reactive to light.  Cardiovascular:     Rate and Rhythm: Normal rate.  Pulmonary:     Effort: Pulmonary effort is normal. No respiratory distress.  Abdominal:     General: There is no distension.     Palpations: Abdomen is soft.  Musculoskeletal:         General: Normal range of motion.     Cervical back: Normal range of motion.  Skin:    General: Skin is warm and dry.  Neurological:     General: No focal deficit present.     Mental Status: She is alert.  Psychiatric:        Mood and Affect: Mood normal.        Behavior: Behavior normal.      UC Treatments / Results  Labs (all labs ordered are listed, but only abnormal results are displayed) Labs Reviewed - No data to display  EKG   Radiology No results found.  Procedures Procedures (including critical care time)  Medications Ordered in UC Medications - No data to display  Initial Impression / Assessment and Plan / UC Course  I have reviewed the triage vital signs and the nursing notes.  Pertinent labs & imaging results that were available during my care of the patient were reviewed by me and considered in my medical decision making (see chart for details).     The site of the cheek looks almost scalded, I think that she is using products that are too harsh and keeping the skin injured.  We will try blander products and cortisone cream.  Not to be used more than a week or 2.  Follow-up with primary care Final Clinical Impressions(s) / UC Diagnoses   Final diagnoses:  Contact dermatitis due to cosmetics, unspecified contact dermatitis type     Discharge Instructions      Stop using harsh soaps and scrubs in your skin.  Use a bland cleanser or Dial soap, in small amounts. Apply triamcinolone ointment to area 2 times a day If needed moisture in addition use something like Aquaphor. If these oils are too heavy for your face you may wipe off the excess with a washcloth You should start to see improvement in the rash in a couple of days, however, the dark pigmentation may take more time to resolve Call your doctor if not improving by next week     ED Prescriptions     Medication Sig Dispense Auth. Provider   triamcinolone ointment (KENALOG) 0.1 % Apply 1  application  topically 2 (two) times daily. 15 g ,  Letta Pate, MD      PDMP not reviewed this encounter.   Eustace Moore, MD 07/27/21 [phone]

## 2021-07-27 NOTE — Discharge Instructions (Signed)
Stop using harsh soaps and scrubs in your skin.  Use a bland cleanser or Dial soap, in small amounts. Apply triamcinolone ointment to area 2 times a day If needed moisture in addition use something like Aquaphor. If these oils are too heavy for your face you may wipe off the excess with a washcloth You should start to see improvement in the rash in a couple of days, however, the dark pigmentation may take more time to resolve Call your doctor if not improving by next week
